# Patient Record
Sex: Female | Born: 1990 | Race: White | Hispanic: No | Marital: Single | Smoking: Former smoker
Health system: Southern US, Community
[De-identification: ages and names within clinical notes are randomized; demographics above are authoritative.]

## PROBLEM LIST (undated history)

## (undated) HISTORY — PX: WISDOM TOOTH EXTRACTION: SHX21

---

## 2015-05-01 ENCOUNTER — Ambulatory Visit
Admission: EM | Admit: 2015-05-01 | Discharge: 2015-05-01 | Disposition: A | Discharge status: Home / Self Care | Payer: Medicaid - Out of State | Attending: Family Medicine | Admitting: Family Medicine

## 2015-05-01 ENCOUNTER — Encounter: Payer: Self-pay | Admitting: *Deleted

## 2015-05-01 DIAGNOSIS — L299 Pruritus, unspecified: Secondary | ICD-10-CM

## 2015-05-01 DIAGNOSIS — B86 Scabies: Secondary | ICD-10-CM | POA: Diagnosis not present

## 2015-05-01 MED ORDER — PERMETHRIN 5 % EX CREA: 1.0000 "application " | TOPICAL_CREAM | Freq: Once | CUTANEOUS | Status: DC

## 2015-05-01 MED ORDER — RANITIDINE HCL 150 MG PO CAPS
150.0000 mg | ORAL_CAPSULE | Freq: Every day | ORAL | Status: DC
Start: 2015-05-01 — End: 2015-12-16

## 2015-05-01 MED ORDER — LORATADINE 10 MG PO TABS: 10.0000 mg | ORAL_TABLET | Freq: Every day | ORAL | Status: DC

## 2015-05-01 NOTE — Discharge Instructions (Signed)
Scabies, Adult Scabies is a skin condition that happens when very small insects get under the skin (infestation). This causes a rash and severe itchiness. Scabies can spread from person to person (is contagious). If you get scabies, it is common for others in your household to get scabies too. With proper treatment, symptoms usually go away in 2-4 weeks. Scabies usually does not cause lasting problems. CAUSES This condition is caused by mites (Sarcoptes scabiei, or human itch mites) that can only be seen with a microscope. The mites get into the top layer of skin and lay eggs. Scabies can spread from person to person through:  Close contact with a person who has scabies.  Contact with infested items, such as towels, bedding, or clothing. RISK FACTORS This condition is more likely to develop in:  People who live in nursing homes and other extended-care facilities.  People who have sexual contact with a partner who has scabies.  Young children who attend child care facilities.  People who care for others who are at increased risk for scabies. SYMPTOMS Symptoms of this condition may include:  Severe itchiness. This is often worse at night.  A rash that includes tiny red bumps or blisters. The rash commonly occurs on the wrist, elbow, armpit, fingers, waist, groin, or buttocks. Bumps may form a line (burrow) in some areas.  Skin irritation. This can include scaly patches or sores. DIAGNOSIS This condition is diagnosed with a physical exam. Your health care provider will look closely at your skin. In some cases, your health care provider may take a sample of your affected skin (skin scraping) and have it examined under a microscope. TREATMENT This condition may be treated with:  Medicated cream or lotion that kills the mites. This is spread on the entire body and left on for several hours. Usually, one treatment with medicated cream or lotion is enough to kill all of the mites. In severe  cases, the treatment may be repeated.  Medicated cream that relieves itching.  Medicines that help to relieve itching.  Medicines that kill the mites. This treatment is rarely used. HOME CARE INSTRUCTIONS Medicines  Take or apply over-the-counter and prescription medicines as told by your health care provider.  Apply medicated cream or lotion as told by your health care provider.  Do not wash off the medicated cream or lotion until the necessary amount of time has passed. Skin Care  Avoid scratching your affected skin.  Keep your fingernails closely trimmed to reduce injury from scratching.  Take cool baths or apply cool washcloths to help reduce itching. General Instructions  Clean all items that you recently had contact with, including bedding, clothing, and furniture. Do this on the same day that your treatment starts.  Use hot water when you wash items.  Place unwashable items into closed, airtight plastic bags for at least 3 days. The mites cannot live for more than 3 days away from human skin.  Vacuum furniture and mattresses that you use.  Make sure that other people who may have been infested are examined by a health care provider. These include members of your household and anyone who may have had contact with infested items.  Keep all follow-up visits as told by your health care provider. This is important. SEEK MEDICAL CARE IF:  You have itching that does not go away after 4 weeks of treatment.  You continue to develop new bumps or burrows.  You have redness, swelling, or pain in your rash area  after treatment.  You have fluid, blood, or pus coming from your rash.   This information is not intended to replace advice given to you by your health care provider. Make sure you discuss any questions you have with your health care provider.   Document Released: 10/30/2014 Document Reviewed: 09/10/2014 Elsevier Interactive Patient Education 2016 [employer]  Rash A rash is a change in the color or feel of your skin. There are many different types of rashes. You may have other problems along with your rash. HOME CARE  Avoid the thing that caused your rash.  Do not scratch your rash.  You may take cools baths to help stop itching.  Only take medicines as told by your doctor.  Keep all doctor visits as told. GET HELP RIGHT AWAY IF:   Your pain, puffiness (swelling), or redness gets worse.  You have a fever.  You have new or severe problems.  You have body aches, watery poop (diarrhea), or you throw up (vomit).  Your rash is not better after 3 days. MAKE SURE YOU:   Understand these instructions.  Will watch your condition.  Will get help right away if you are not doing well or get worse.   This information is not intended to replace advice given to you by your health care provider. Make sure you discuss any questions you have with your health care provider.   Document Released: 07/28/2007 Document Revised: 05/03/2011 Document Reviewed: 06/26/2014 Elsevier Interactive Patient Education [employer]vier Inc.

## 2015-05-01 NOTE — ED Provider Notes (Signed)
CSN: [id number]     Arrival date & time 05/01/15  1303 History   First MD Initiated Contact with Patient 05/01/15 1546    Nurses notes were reviewed. Chief Complaint  Patient presents with  . Rash  Is here because of a rash on her right hand right foot and right leg. She states she was visiting her mother [place] a [place] in [place] and they had a scabies infection that have been just recently treated. She states that she got home and noticed that she was having some itching of the right hand but couldn't see anything such thought she was okay. Last few days she's broken out in a rash with itching on the right foot right lower leg and right handout as well. She realizes she's got scabies infection that her mother and sibling had. She reports that the lesions are very pruritic in nature and that which took Benadryl it made her very sedated  Before she she smokes on no other current medical problems with her period is a history of heart disease in the family.   (Consider location/radiation/quality/duration/timing/severity/associated sxs/prior Treatment) Patient is a 25 y.o. female presenting with rash. The history is provided by the patient. No language interpreter was used.  Rash Location:  Foot, leg, hand and shoulder/arm Hand rash location:  Dorsum of R hand and R wrist Leg rash location:  R foot Foot rash location:  Top of R foot Quality: itchiness, redness and swelling   Quality: not blistering, not bruising and not draining   Severity:  Moderate Timing:  Constant Chronicity:  New Relieved by:  Nothing Ineffective treatments:  Antihistamines Associated symptoms: abdominal pain   Associated symptoms: no fatigue, no fever, no headaches and no URI     History reviewed. No pertinent past medical history. History reviewed. No pertinent past surgical history. History reviewed. No pertinent family history. Social History  Substance Use Topics  . Smoking status: Current Every Day Smoker  .  Smokeless tobacco: Never Used  . Alcohol Use: Yes   OB History    No data available     Review of Systems  Constitutional: Negative for fever and fatigue.  Gastrointestinal: Positive for abdominal pain.  Skin: Positive for rash.  Neurological: Negative for headaches.  All other systems reviewed and are negative.   Allergies  Review of patient's allergies indicates no known allergies.  Home Medications   Prior to Admission medications   Medication Sig Start Date End Date Taking? Authorizing Provider  etonogestrel (NEXPLANON) 68 MG IMPL implant 1 each by Subdermal route once.   Yes Historical Provider, MD  loratadine (CLARITIN) 10 MG tablet Take 1 tablet (10 mg total) by mouth daily. Take 1 tablet in the morning. As needed for itching. 05/01/15   Hassan RowanEugene Cian Costanzo, MD  permethrin (ELIMITE) 5 % cream Apply 1 application topically once. Follow hygienic practices as described use one time and repeat in a week if needed recommend anyone sharing her bed use it as well 05/01/15   Hassan RowanEugene Felishia Wartman, MD  ranitidine (ZANTAC) 150 MG capsule Take 1 capsule (150 mg total) by mouth daily. 05/01/15   Hassan RowanEugene Alontae Chaloux, MD   Meds Ordered and Administered this Visit  Medications - No data to display  BP 106/56 mmHg  Pulse 68  Temp(Src) 97.8 F (36.6 C) (Oral)  Resp 18  Ht 5\' 2"  (1.575 m)  Wt 155 lb (70.308 kg)  BMI 28.34 kg/m2  SpO2 100%  LMP 04/30/2015 No data found.   Physical Exam  Constitutional: She is oriented to person, place, and time. She appears well-developed and well-nourished.  HENT:  Head: Normocephalic and atraumatic.  Eyes: Pupils are equal, round, and reactive to light.  Neck: Neck supple.  Musculoskeletal: Normal range of motion. She exhibits no tenderness.  Neurological: She is alert and oriented to person, place, and time.  Skin: Rash noted. There is erythema.     Lesions on her right leg lower leg and right forearm and hand consistent with pediculosis infection and they does involve  the distal ends of both extremities  Psychiatric: She has a normal mood and affect. Her behavior is normal.  Vitals reviewed.   ED Course  Procedures (including critical care time)  Labs Review Labs Reviewed - No data to display  Imaging Review No results found.   Visual Acuity Review  Right Eye Distance:   Left Eye Distance:   Bilateral Distance:    Right Eye Near:   Left Eye Near:    Bilateral Near:         MDM   1. Scabies   2. Itching    We'll place patient on Elimite lotion went over hygienic instructions on how to prepare her house bedroom and her clothes. Also recommend that she have anyone who states with her be treated as well she states that her fianc shares a bed that she will talk to her fianc she may repeat Elimite lotion in one week and gave her a refill on the Elimite lotion. Will also place patient on Zantac twice a day and Claritin once a day for the itching recommend she stops the Benadryl since she is needs to wash the Elimite lotion off in 10 days and give her a note for work for today and tomorrow. Also started the things she can't wash like her shoes she should not wear for 3 or 4 days.       Hassan Rowan, MD 05/01/15 1640

## 2015-05-01 NOTE — ED Notes (Signed)
Patient noticed a few bumps on her right leg 4 days ago that have begin to spread on her right leg to her right arm. Patient reports that she visited her mother and brother in [place] and they were diagnosed with scabies.

## 2015-12-16 ENCOUNTER — Ambulatory Visit (INDEPENDENT_AMBULATORY_CARE_PROVIDER_SITE_OTHER): Payer: Self-pay

## 2015-12-16 ENCOUNTER — Ambulatory Visit
Admission: EM | Admit: 2015-12-16 | Discharge: 2015-12-16 | Disposition: A | Discharge status: Home / Self Care | Payer: Self-pay | Attending: Internal Medicine | Admitting: Internal Medicine

## 2015-12-16 DIAGNOSIS — S46912A Strain of unspecified muscle, fascia and tendon at shoulder and upper arm level, left arm, initial encounter: Secondary | ICD-10-CM

## 2015-12-16 NOTE — Discharge Instructions (Addendum)
Ice 10-15 minutes 2-4 times daily may help reduce pain.  Ibuprofen will help with pain, inflammation.  Prilosec (omeprazole) or rantidine (zantac) or pepcid (famotidine) may be helpful if stomach upset occurs with the ibuprofen.  Physical therapy may be helpful for improving shoulder movement as healing occurs.  Followup with orthopedist or sports medicine in a week or two.

## 2015-12-16 NOTE — ED Provider Notes (Signed)
[facility]    CSN: [id number] Arrival date & time: 12/16/15  1501     History   Chief Complaint Chief Complaint  Patient presents with  . Shoulder Pain    Left    HPI Meghan Barber is a 25 y.o. female. She was wrestling with a friend 4 days ago, and had the forced AB duction of her left shoulder across the front of her body. She was at that time prone, and then the friend applied some weight to the shoulder. She has had significant discomfort with range of motion of the left shoulder since. No tingling or numbness in the hand. Is not able to actively move the elbow away from the body. Not improving. No other injuries reported. Wrestled in high school.    HPI  History reviewed. No pertinent past medical history.   Past Surgical History:  Procedure Laterality Date  . WISDOM TOOTH EXTRACTION      Home Medications    Prior to Admission medications   Medication Sig Start Date End Date Taking? Authorizing Provider  etonogestrel (NEXPLANON) 68 MG IMPL implant 1 each by Subdermal route once.    Historical Provider, MD  taking ibuprofen 800mg  4x daily for shoulder pain  Family History Family History  Problem Relation Age of Onset  . Thyroid disease Mother     Social History Social History  Substance Use Topics  . Smoking status: Current Every Day Smoker  . Smokeless tobacco: Never Used  . Alcohol use Yes     Comment: occ     Allergies   Percocet [oxycodone-acetaminophen] and Vicodin [hydrocodone-acetaminophen]   Review of Systems Review of Systems  All other systems reviewed and are negative.    Physical Exam Triage Vital Signs ED Triage Vitals  Enc Vitals Group     BP 12/16/15 1516 129/69     Pulse Rate 12/16/15 1516 77     Resp 12/16/15 1516 17     Temp 12/16/15 1516 97.7 F (36.5 C)     Temp Source 12/16/15 1516 Tympanic     SpO2 12/16/15 1516 100 %     Weight 12/16/15 1514 145 lb (65.8 kg)     Height 12/16/15 1514 5\' 2"  (1.575 m)      Pain Score 12/16/15 1516 9   Updated Vital Signs BP 129/69 (BP Location: Left Arm)   Pulse 77   Temp 97.7 F (36.5 C) (Tympanic)   Resp 17   Ht 5\' 2"  (1.575 m)   Wt 145 lb (65.8 kg)   LMP 11/20/2015   SpO2 100%   BMI 26.52 kg/m  Physical Exam  Constitutional: She is oriented to person, place, and time. No distress.  Alert, nicely groomed  HENT:  Head: Atraumatic.  Eyes:  Conjugate gaze, no eye redness/drainage  Neck: Neck supple.  Cardiovascular: Normal rate.   Pulmonary/Chest: No respiratory distress.  Lungs clear, symmetric breath sounds  Abdominal: She exhibits no distension.  Musculoskeletal:  No leg swelling Extremely limited active ROM L shoulder (pain): no significant extension of arm at shoulder.  Able to extend arm at shoulder passively to 45 degrees.  Anterior aspect of shoulder contour is tender to palpation.  Spasm of L trapezius.    Neurological: She is alert and oriented to person, place, and time.  Skin: Skin is warm and dry.  No cyanosis  Nursing note and vitals reviewed.    UC Treatments / Results   Radiology Dg Shoulder Left  Result Date: 12/16/2015 CLINICAL  DATA:  Injured left shoulder while wrestling. Persistent pain and popping. EXAM: LEFT SHOULDER - 2+ VIEW COMPARISON:  None. FINDINGS: The joint spaces are maintained. No acute bony findings or bone lesion. No abnormal soft tissue calcifications. The visualized lung is clear and the visualized ribs are intact. IMPRESSION: Normal left shoulder radiographs. Electronically Signed   By: Rudie MeyerP.  Gallerani M.D.   On: 12/16/2015 16:17    Procedures Procedures (including critical care time)      None today   Final Clinical Impressions(s) / UC Diagnoses   Final diagnoses:  Strain of left shoulder, initial encounter   Ice 10-15 minutes 2-4 times daily may help reduce pain.  Ibuprofen will help with pain, inflammation.  Prilosec (omeprazole) or rantidine (zantac) or pepcid (famotidine) may be helpful  if stomach upset occurs with the ibuprofen.  Physical therapy may be helpful for improving shoulder movement as healing occurs.  Followup with orthopedist or sports medicine in a week or two.       Eustace MooreLaura W Chloeann Alfred, MD 12/17/15 [phone]

## 2015-12-16 NOTE — ED Triage Notes (Signed)
Patient states that on last Thursday night she was wrestling with a friend of hers and she felt a pop in her left shoulder about 4 times and she has been having pain since with limited ROM. Patient states that pain radiates down through deltoid muscle.

## 2015-12-18 ENCOUNTER — Telehealth: Payer: Self-pay

## 2015-12-18 NOTE — Telephone Encounter (Signed)
Tried to follow up with patient, however the number on file is no longer a working number.

## 2018-03-03 DIAGNOSIS — A6000 Herpesviral infection of urogenital system, unspecified: Secondary | ICD-10-CM | POA: Insufficient documentation

## 2018-03-03 DIAGNOSIS — E663 Overweight: Secondary | ICD-10-CM | POA: Insufficient documentation

## 2018-03-03 DIAGNOSIS — Z9152 Personal history of nonsuicidal self-harm: Secondary | ICD-10-CM | POA: Insufficient documentation

## 2018-03-03 DIAGNOSIS — Z8659 Personal history of other mental and behavioral disorders: Secondary | ICD-10-CM | POA: Insufficient documentation

## 2018-03-03 LAB — HM PAP SMEAR: HM Pap smear: NEGATIVE

## 2018-10-16 DIAGNOSIS — E663 Overweight: Secondary | ICD-10-CM

## 2018-10-16 DIAGNOSIS — Z9152 Personal history of nonsuicidal self-harm: Secondary | ICD-10-CM

## 2018-10-16 DIAGNOSIS — Z8659 Personal history of other mental and behavioral disorders: Secondary | ICD-10-CM

## 2018-10-17 ENCOUNTER — Ambulatory Visit (LOCAL_COMMUNITY_HEALTH_CENTER): Payer: Self-pay | Admitting: Nurse Practitioner

## 2018-10-17 ENCOUNTER — Other Ambulatory Visit: Payer: Self-pay

## 2018-10-17 DIAGNOSIS — Z3041 Encounter for surveillance of contraceptive pills: Secondary | ICD-10-CM

## 2018-10-17 DIAGNOSIS — Z3009 Encounter for other general counseling and advice on contraception: Secondary | ICD-10-CM

## 2018-10-17 DIAGNOSIS — Z113 Encounter for screening for infections with a predominantly sexual mode of transmission: Secondary | ICD-10-CM

## 2018-10-17 DIAGNOSIS — Z1331 Encounter for screening for depression: Secondary | ICD-10-CM

## 2018-10-17 LAB — WET PREP FOR TRICH, YEAST, CLUE
Trichomonas Exam: NEGATIVE
Yeast Exam: NEGATIVE

## 2018-10-17 LAB — HEMOGLOBIN, FINGERSTICK: Hemoglobin: 11.9 g/dL (ref 11.1–15.9)

## 2018-10-17 MED ORDER — NORGESTIM-ETH ESTRAD TRIPHASIC 0.18/0.215/0.25 MG-25 MCG PO TABS: 1.0000 | ORAL_TABLET | Freq: Every day | ORAL | 0 refills | Status: DC

## 2018-10-17 NOTE — Patient Instructions (Signed)
Depression Screening Depression screening is a tool that your health care provider can use to learn if you have symptoms of depression. Depression is a common condition with many symptoms that are also often found in other conditions. Depression is treatable, but it must first be diagnosed. You may not know that certain feelings, thoughts, and behaviors that you are having can be symptoms of depression. Taking a depression screening test can help you and your health care provider decide if you need more assessment, or if you should be referred to a mental health care provider. What are the screening tests?  You may have a physical exam to see if another condition is affecting your mental health. You may have a blood or urine sample taken during the physical exam.  You may be interviewed using a screening tool that was developed from research, such as one of these: ? Patient Health Questionnaire (PHQ). This is a set of either 2 or 9 questions. A health care provider who has been trained to score this screening test uses a guide to assess if your symptoms suggest that you may have depression. ? Hamilton Depression Rating Scale (HAM-D). This is a set of either 17 or 24 questions. You may be asked to take it again during or after your treatment, to see if your depression has gotten better. ? Beck Depression Inventory (BDI). This is a set of 21 multiple choice questions. Your health care provider scores your answers to assess:  Your level of depression, ranging from mild to severe.  Your response to treatment.  Your health care provider may talk with you about your daily activities, such as eating, sleeping, work, and recreation, and ask if you have had any changes in activity.  Your health care provider may ask you to see a mental health specialist, such as a psychiatrist or psychologist, for more evaluation. Who should be screened for depression?   All adults, including adults with a family history  of a mental health disorder.  Adolescents who are 12-18 years old.  People who are recovering from a myocardial infarction (MI).  Pregnant women, or women who have given birth.  People who have a long-term (chronic) illness.  Anyone who has been diagnosed with another type of a mental health disorder.  Anyone who has symptoms that could show depression. What do my results mean? Your health care provider will review the results of your depression screening, physical exam, and lab tests. Positive screens suggest that you may have depression. Screening is the first step in getting the care that you may need. It is up to you to get your screening results. Ask your health care provider, or the department that is doing your screening tests, when your results will be ready. Talk with your health care provider about your results and diagnosis. A diagnosis of depression is made using the Diagnostic and Statistical Manual of Mental Disorders (DSM-V). This is a book that lists the number and type of symptoms that must be present for a health care provider to give a specific diagnosis.  Your health care provider may work with you to treat your symptoms of depression, or your health care provider may help you find a mental health provider who can assess, diagnose, and treat your depression. Get help right away if:  You have thoughts about hurting yourself or others. If you ever feel like you may hurt yourself or others, or have thoughts about taking your own life, get help right away. You   can go to your nearest emergency department or call:  Your local emergency services (911 in the [place]  A suicide crisis helpline, such as the National Suicide Prevention Lifeline at 1-800-273-8255. This is open 24 hours a day. Summary  Depression screening is the first step in getting the help that you may need.  If your screening test shows symptoms of depression (is positive), your health care provider may ask  you to see a mental health provider.  Anyone who is age 12 or older should be screened for depression. This information is not intended to replace advice given to you by your health care provider. Make sure you discuss any questions you have with your health care provider. Document Released: 06/25/2016 Document Revised: 01/21/2017 Document Reviewed: 06/25/2016 Elsevier Patient Education  2020 [employer] Inc[employer]  

## 2018-10-17 NOTE — Progress Notes (Signed)
Family Planning Visit- Repeat Yearly Visit - to continue OCP's  Subjective:  Meghan Barber is a 28 y.o. being seen today for an well woman visit and to discuss family planning options.    She is currently using none for pregnancy prevention. Patient reports she does not if she or her partner wants a pregnancy in the next year. Patient  has H/O self mutilation; History of bulimia nervosa; Genital HSV; and Overweight on their problem list.  No chief complaint on file.   Patient reports - [facility]  Patient denies - any additional significant medical history  Does the patient desire a pregnancy in the next year? (OKQ flowsheet)  See flowsheet for other program required questions.   There is no height or weight on file to calculate BMI. - Patient is eligible for diabetes screening based on BMI and age 33>40?  no HA1C ordered? no  Patient reports 1 of partners in last year. Desires STI screening?  Yes  Does the patient have a current or past history of drug use? No   No components found for: HCV]   Health Maintenance Due  Topic Date Due  . HIV Screening  09/23/2007  . TETANUS/TDAP  09/23/2011  . INFLUENZA VACCINE  09/23/2018    Review of Systems  All other systems reviewed and are negative.   The following portions of the patient's history were reviewed and updated as appropriate: allergies, current medications, past family history, past medical history, past social history, past surgical history and problem list. Problem list updated.  Objective:  There were no vitals filed for this visit.  Physical Exam Vitals signs reviewed.  Constitutional:      Appearance: Normal appearance. She is well-developed and normal weight.  HENT:     Mouth/Throat:     Comments: Tooth decay noted - no missing teeth Cardiovascular:     Rate and Rhythm: Normal rate and regular rhythm.     Pulses:          Radial pulses are 2+ on the right side and 2+ on the left side.      Heart sounds: Normal heart sounds, S1 normal and S2 normal.  Pulmonary:     Effort: Pulmonary effort is normal.     Breath sounds: Normal breath sounds and air entry.  Musculoskeletal:     Right lower leg: No edema.     Left lower leg: No edema.  Skin:    General: Skin is warm and dry.  Neurological:     Mental Status: She is alert.  Psychiatric:        Behavior: Behavior is cooperative.      Assessment and Plan:  Meghan Barber is a 28 y.o. female [facility] for an initial well woman exam/family planning visit  Contraception counseling: Reviewed all forms of birth control options available including abstinence; over the counter/barrier methods; hormonal contraceptive medication including pill, patch, ring, injection,contraceptive implant; hormonal and nonhormonal IUDs; permanent sterilization options including vasectomy and the various tubal sterilization modalities. Risks and benefits reviewed.  Questions were answered.  Written information was also given to the patient to review.  Patient desires OCP's , this was prescribed for patient. She will follow up in  6 months for surveillance.  She was told to call with any further questions, or with any concerns about this method of contraception.  Emphasized use of condoms 100% of the time for STI prevention.  There are  no diagnoses linked to this encounter. 1. Family planning, BCP (birth control pills) maintenance Please give  - Norgestimate-Ethinyl Estradiol Triphasic 0.18/0.215/0.25 MG-25 MCG tab; Take 1 tablet by mouth daily.  Dispense: 3 Package; Refill: 0  Discussed with client how to properly take OCP's - discussed what to do if pills are missed - advised to use condoms consistently x 2 wks as back up method while starting OCP's.   2. Screening examination for STD (sexually transmitted disease) Await lab results - WET PREP FOR [place], [place] - please treat wet mount per standing  orer - HIV [place] [facility] - Syphilis Serology, [place] [facility] - Chlamydia/Gonorrhea [place] [facility]  3. Positive depression screening  - Hemoglobin, fingerstick - await results  Positive PHQ 9 score - please refer to Milton Ferguson for services - Ambulatory referral to [facility] understanding and is in agreement with plan of care     Return in about 6 months (around 04/19/2019) for Continue OCP's for [facility] - pick up remaining 6 packs of OCP's, PRN.  No future appointments.  Berniece Andreas, NP

## 2018-10-18 ENCOUNTER — Encounter: Payer: Self-pay | Admitting: Nurse Practitioner

## 2018-11-16 ENCOUNTER — Ambulatory Visit: Payer: Self-pay | Admitting: Licensed Clinical Social Worker

## 2018-11-16 ENCOUNTER — Encounter: Payer: Self-pay | Admitting: Licensed Clinical Social Worker

## 2018-11-16 ENCOUNTER — Other Ambulatory Visit: Payer: Self-pay

## 2018-11-16 DIAGNOSIS — F4323 Adjustment disorder with mixed anxiety and depressed mood: Secondary | ICD-10-CM | POA: Insufficient documentation

## 2018-11-16 NOTE — Progress Notes (Signed)
Counselor Initial Adult Exam  Name: Meghan Barber Date: 11/16/2018 MRN: [id number] DOB: [birth year] PCP: Patient, No Pcp Per  Time spent: 1 hour 10 minutes   A biopsychosocial was completed on the Patient. Background information and current concerns were obtained during an intake in the office with the [facility] Department clinician, Glori Bickers, LCSW.  Contact information and confidentiality was discussed and appropriate consents were signed.    Reason for Visit /Presenting Problem: Patient referred by Jerline Pain, FNP on 08/25/220 due to PHQ-9 = 9.  Patient reports that she has had many difficulties in relationships due to trust issues. She reports that a friendship that was meaningful to her ended recently due to the patient not believing that her friend was pregnant. Patient reports that there were several factors that made her believe her friend was making up the pregnancy, but in the end her questioning led this friend to end the relationship. Patient reports that this situation led to her going to hang out with some other friends and drinking - 3 beers and 1 shot in an hour. She reports that she blacked out for a 6 hour period and ended up getting a DUI during this blackout period. She reports that this is her 2nd DUI, the first occurred in 03/2016 after "drinnking a lot" to cope with her and her boyfriends issues at that time. Patient shares that she over-thinks things, has trust issues and is always looking for clues/reasons that she shouldn't trust someone or that someone isn't authentic. She reports a history of childhood instability, her mom having mental health issues, and experiencing homelessness. 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 Patient shares that due to issues she was having she moved to [facility] and ended up in bad relationship for a few years before she moved to [facility]. She moved  to [place] because her father was living here at the time. Patient reports anxious/paranoid thoughts about others intentions, a history of relationship instability, sabotaging relationships and career opportunities, and has a fear of failing. She reports that she has ruined job opportunities because of her worries about her boyfriend cheating. Patient reports a lot of social anxiety and fears of others judging her, she reports she drinks in social situations to feel comfortable. She currently endorses depressive symptoms due to recent events - ending of friendship and DUI. In addition, patient reports history of insomnia, and reports use of marijuana to help her sleep.   Mental Status Exam:   Appearance:   Casual     Behavior:  Appropriate  Motor:  Normal  Speech/Language:   Normal Rate  Affect:  Appropriate  Mood:  normal  Thought process:  normal  Thought content:    WNL  Sensory/Perceptual disturbances:    WNL  Orientation:  oriented to person, place and time/date  Attention:  Good  Concentration:  Good  Memory:  WNL  Fund of knowledge:   Good  Insight:    Good  Judgment:   Good  Impulse Control:  Good   Reported Symptoms:  Obsessive thinking, Anhedonia, Sleep disturbance, Appetite disturbance, Reckless behavior, Lack of motivation and anxiety, anxious thoughts   Risk Assessment: Danger to Self:  No Self-injurious Behavior: No Danger to Others: No Duty to Warn:no Physical Aggression / Violence:No  Access to Firearms a concern: No  Gang Involvement:No  Patient / guardian was educated about steps to take if  suicide or homicide risk level increases between visits: yes While future psychiatric events cannot be accurately predicted, the patient does not currently require acute inpatient psychiatric care and does not currently meet [facility] involuntary commitment criteria.  Substance Abuse History: Current substance abuse: Yes - Occasional marijuana usage. Alcohol use.   Past  Psychiatric History:   No previous psychological problems have been observed. Patient reports that her mom has history of unknown mental health issues and her father has a history of alcoholism.  Outpatient Providers:NA  History of Psych Hospitalization: No   Abuse History: Victim of No., NA    Report needed: No. Victim of Neglect:Yes.   Perpetrator of No   Witness / Exposure to Domestic Violence: No   Protective Services Involvement: No  Witness to [employer] Violence:  No   Family History:  Family History  Problem Relation Age of Onset  . Thyroid disease Mother   . Alcoholism Father     Social History:  Social History   Socioeconomic History  . Marital status: Single    Spouse name: Not on file  . Number of children: Not on file  . Years of education: Not on file  . Highest education level: Not on file  Occupational History  . Not on file  Social Needs  . Financial resource strain: Not on file  . Food insecurity    Worry: Not on file    Inability: Not on file  . Transportation needs    Medical: Not on file    Non-medical: Not on file  Tobacco Use  . Smoking status: Current Every Day Smoker  . Smokeless tobacco: Never Used  Substance and Sexual Activity  . Alcohol use: Yes    Comment: occ  . Drug use: No  . Sexual activity: Not on file  Lifestyle  . Physical activity    Days per week: 1 day    Minutes per session: 120 min  . Stress: Very much  Relationships  . Social Musicianconnections    Talks on phone: Not on file    Gets together: Not on file    Attends religious service: Not on file    Active member of club or organization: Not on file    Attends meetings of clubs or organizations: Not on file    Relationship status: Not on file  Other Topics Concern  . Not on file  Social History Narrative   Patient reports that she and her boyfriend of 3 years live together. She reports not having many friends, and doesn't have any family in [place]. Patient reports that her  family lives in [place]. She has a close relationship with her sister, and also reports having good relationships with her other family members.     Living situation: the patient lives with their partner  Sexual Orientation:  Straight  Relationship Status: living with boyfriend of 3 years   Name of spouse / other: NA              If a parent, number of children / ages: NA   Support Systems; significant other/boyfriend  And close relationship with sister.   Financial Stress:  None reported   Income/Employment/Disability: Employment  Financial plannerMilitary Service: No   Educational History: Education: student currently attending college  Religion/Sprituality/World View:   NA   Any cultural differences that may affect / interfere with treatment:  not applicable   Recreation/Hobbies: lifting weights   Stressors:Other: not having friends, recent end to close friendship  Strengths:  Able to Communicate Effectively and stable housing, close relationship with sister    Barriers:  None reported    Legal History: Pending legal issue / charges: The patient has been involved with the police as a result of 2 DUI's . History of legal issue / charges: DUI  Medical History/Surgical History:not reviewed No past medical history on file.  Past Surgical History:  Procedure Laterality Date  . WISDOM TOOTH EXTRACTION      Medications: Current Outpatient Medications  Medication Sig Dispense Refill  . Norgestimate-Ethinyl Estradiol Triphasic (ORTHO TRI-CYCLEN, 28,) 0.18/0.215/0.25 MG-35 MCG tablet Take 1 tablet by mouth daily.    . Norgestimate-Ethinyl Estradiol Triphasic 0.18/0.215/0.25 MG-25 MCG tab Take 1 tablet by mouth daily. 3 Package 0   No current facility-administered medications for this visit.     Allergies  Allergen Reactions  . Lidocaine Other (See Comments)    syncope  . Percocet [Oxycodone-Acetaminophen] Nausea And Vomiting  . Vicodin [Hydrocodone-Acetaminophen] Nausea And  Vomiting   Ms. Magenta Philips is a 28 year old female with no reported history of mental health diagnosis. Patient currently presents with depressive symptoms and anxiety. She describes symptoms of adjustment  disorder, including depressed mood and anxiety due to recent stressors - ending of close friendship and DUI. Patient also describes a history of challenges in interpersonal relationships, including anxious/paranoid thoughts about others intentions, a history of relationship instability, and sabotaging relationships.  Furthermore, patient reports fears of others judging her, and reports she drinks in social situations to feel comfortable. In addition, patient reports history of insomnia and reports marijuana use to help manage it. Patient reports that these symptoms significantly impact her functioning in multiple life domains.   Due to the above symptoms and patient's reported history, patient is diagnosed with Adjustment Disorder, With mixed anxiety and depressed mood. Patient's symptoms and alcohol use should continue to be monitored closely to provide further diagnosis clarification. Continued mental health treatment is needed to address patient's symptoms and monitor her safety and stability. Patient is recommended for outpatient therapy to further reduce her symptoms and improve her coping strategies.    There is no acute risk for suicide or violence at this time.  While future psychiatric events cannot be accurately predicted, the patient does not require acute inpatient psychiatric care and does not currently meet [facility] involuntary commitment criteria.    Diagnoses:    ICD-10-CM   1. Adjustment disorder with mixed anxiety and depressed mood  F43.23     Plan of Care:  LCSW provided psychoeducation on CBTs and discussed with patient developing a treatment plan using CBTs at next session.  Patient agreed to use of Zoom and scheduled an appointment for next week.  Encouraged  patient to have a physical with PCP due to reported heart palpitations and family history of tyroid disorder.  Patient's goal of treatment: To have better control over her mind, not mess things up, learn how not to be all or nothing.   Interpreter used: NA   Kathreen Cosier, LCSW

## 2018-11-22 ENCOUNTER — Encounter: Payer: Self-pay | Admitting: Licensed Clinical Social Worker

## 2018-11-22 ENCOUNTER — Ambulatory Visit: Payer: Self-pay | Admitting: Licensed Clinical Social Worker

## 2018-11-22 DIAGNOSIS — F4323 Adjustment disorder with mixed anxiety and depressed mood: Secondary | ICD-10-CM

## 2018-11-22 NOTE — Progress Notes (Signed)
Counselor/Therapist Progress Note  Patient ID: Meghan Barber, MRN: [id number],    Date: 11/22/2018  Time Spent: 87 minutes    Treatment Type: Psychotherapy  Reported Symptoms: Obsessive thinking, Sleep disturbance, Lack of motivation and anxiety, anxious thoughts  Mental Status Exam:   Appearance:   Casual     Behavior:  Appropriate and Sharing  Motor:  Normal  Speech/Language:   Normal Rate  Affect:  Appropriate  Mood:  normal  Thought process:  normal  Thought content:    WNL  Sensory/Perceptual disturbances:    WNL  Orientation:  oriented to person, place and time/date  Attention:  Good  Concentration:  Good  Memory:  WNL  Fund of knowledge:   Good  Insight:    Good  Judgment:   Good  Impulse Control:  Good   Risk Assessment: Danger to Self:  No Self-injurious Behavior: No Danger to Others: No Duty to Warn:no Physical Aggression / Violence:No  Access to Firearms a concern: No  Gang Involvement:No   Subjective: Patient was engaged and cooperative throughout the session using time effectively to discuss thoughts, feelings, and treatment plan.  Patient voices continued motivation for treatment and understanding of mood and anxiety issues related to significant events. Patient is likely to benefit from future treatment because she remains motivated to decrease symptoms and reports benefit of regular sessions.     Interventions: Cognitive Behavioral Therapy  Established psychological safety. Checked in with patient.  Checked in with patient and reviewed previous session, including assessment and goal of treatment. Reviewed CBTs. Explored patient's goal of treatment and worked collaboratively to develop CBT treatment plan. Provided Psychoeducation on mindfulness, engaged patient in mindfulness exercise, processed exercise, and contracted with patient to complete daily. Provided support through active listening, validation of feelings, and highlighted patient's  strengths.  Diagnosis:   ICD-10-CM   1. Adjustment disorder with mixed anxiety and depressed mood  F43.23     Plan:   To have better control over her mind, not mess things up, learn how not to be all or nothing.  Treatment Target: Understand the relationship between thoughts, emotions, and behaviors  - Psychoeducation on CBT model   - Teach the connection between thoughts, emotions, and behaviors  - Identify and describe emotions   Treatment Target: Continue to understand mood and anxiety symptoms  - Ongoing assessment  - Describe current and past experiences with mood and anxiety issues - Identify, if possible, the source of mood and anxiety challenges - Assess depth of mood and anxiety and need for  medication Treatment Target: Increase realistic balanced thinking  - Explore patient's thoughts, beliefs, automatic thoughts, assumptions  - Identify and replace thinking that leads to depression - Process distress and allow for emotional release  - Questioning and challenging thoughts - Cognitive reappraisal  -  Provided psychoeducation on core beliefs, explore, and assist patient in identifying core beliefs  Treatment Target: Reducing vulnerability to "emotional mind" - Values clarification   - Self-care - nutrition, sleep, exercise  - Increase positive events - pleasurable and meaningful - Mindfulness practices   Interpreter used: NA   Milton Ferguson, LCSW

## 2018-11-24 ENCOUNTER — Other Ambulatory Visit: Payer: Self-pay

## 2018-11-24 DIAGNOSIS — Z20822 Contact with and (suspected) exposure to covid-19: Secondary | ICD-10-CM

## 2018-11-25 LAB — NOVEL CORONAVIRUS, NAA: SARS-CoV-2, NAA: NOT DETECTED

## 2018-11-28 ENCOUNTER — Ambulatory Visit: Payer: Self-pay | Admitting: Licensed Clinical Social Worker

## 2018-11-28 DIAGNOSIS — F4323 Adjustment disorder with mixed anxiety and depressed mood: Secondary | ICD-10-CM

## 2018-11-28 NOTE — Progress Notes (Signed)
Counselor/Therapist Progress Note  Patient ID: Meghan Barber, MRN: [id number],    Date: 11/28/2018  Time Spent: 45 minutes  Treatment Type: Psychotherapy  Reported Symptoms: Obsessive thinking, Sleep disturbance and Anxiety, anxious thoughts  Mental Status Exam:  Appearance:   Casual     Behavior:  Appropriate and Sharing  Motor:  Normal  Speech/Language:   Normal Rate  Affect:  Appropriate  Mood:  normal  Thought process:  normal  Thought content:    WNL  Sensory/Perceptual disturbances:    WNL  Orientation:  oriented to person, place and time/date  Attention:  Good  Concentration:  Good  Memory:  WNL  Fund of knowledge:   Good  Insight:    Good  Judgment:   Good  Impulse Control:  Good   Risk Assessment: Danger to Self:  No Self-injurious Behavior: No Danger to Others: No Duty to Warn:no Physical Aggression / Violence:No  Access to Firearms a concern: No  Gang Involvement:No   Subjective: Patient was engaged and cooperative throughout the session using time effectively to discuss thoughts and feelings. Patient voices continued motivation for treatment, agreement with treatment plans and understanding of mood and anxiety issues. Patient is likely to benefit from future treatment because she remains motivated to decrease symptoms and improve functioning and reports benefit of regular sessions.    Interventions: Cognitive Behavioral Therapy  Established psychological safety. Checked in with patient and discussed current mood and psychosocial stressors. Reviewed previous session, including treatment plan, CBTs and mindfulness breathing. Engaged patient in charting out thoughts, feelings, and emotions and taught patient about ABC worksheet. Encouraged patient to journal using ABC method, and to use one minute counting breaths daily. Provided support through active listening, validation of feelings, and highlighted patient's strengths.  Diagnosis:   ICD-10-CM   1.  Adjustment disorder with mixed anxiety and depressed mood  F43.23     Plan: Check in on use of one minute counting breaths; journal using ABC method.   To have better control over her mind, notmess things up, learn hownot to be all or nothing.  Treatment Target: Understand the relationship between thoughts, emotions, and behaviors   Psychoeducation on CBT model    Teach the connection between thoughts, emotions, and behaviors   Identify and describe emotions   Treatment Target: Continue to understand mood and anxiety symptoms   Ongoing assessment   Describe current and past experiences with mood and anxiety issues  Identify, if possible, the source of mood and anxiety challenges  Assess depth of mood and anxiety and need for  medication Treatment Target: Increase realistic balanced thinking   Explore patient's thoughts, beliefs, automatic thoughts, assumptions   Identify and replace thinking that leads to depression  Process distress and allow for emotional release   Questioning and challenging thoughts  Cognitive reappraisal    Provided psychoeducation on core beliefs, explore, and assist patient in identifying core beliefs  Treatment Target: Reducing vulnerability to "emotional mind"  Values clarification    Self-care - nutrition, sleep, exercise   Increase positive events - pleasurable and meaningful  Mindfulness practices   Interpreter used: NA   Milton Ferguson, LCSW

## 2018-12-05 ENCOUNTER — Ambulatory Visit: Payer: Self-pay | Admitting: Licensed Clinical Social Worker

## 2018-12-05 DIAGNOSIS — F4323 Adjustment disorder with mixed anxiety and depressed mood: Secondary | ICD-10-CM

## 2018-12-05 NOTE — Progress Notes (Signed)
Counselor/Therapist Progress Note  Patient ID: Meghan Barber, MRN: [id number],    Date: 12/05/2018  Time Spent: 45 minutes    Treatment Type: Psychotherapy  Reported Symptoms: overall mood stability; anxious thoughts   Mental Status Exam:  Appearance:   Casual     Behavior:  Appropriate and Sharing  Motor:  Normal  Speech/Language:   Normal Rate  Affect:  Appropriate  Mood:  euthymic  Thought process:  normal  Thought content:    WNL  Sensory/Perceptual disturbances:    WNL  Orientation:  oriented to person, place and time/date  Attention:  Good  Concentration:  Good  Memory:  WNL  Fund of knowledge:   Good  Insight:    Good  Judgment:   Good  Impulse Control:  Good   Risk Assessment: Danger to Self:  No Self-injurious Behavior: No Danger to Others: No Duty to Warn:no Physical Aggression / Violence:No  Access to Firearms a concern: No  Gang Involvement:No   Subjective: Patient was engaged and cooperative throughout the session using time effectively to discuss thoughts and feelings. Patient voices continued motivation for treatment and reports writing out thoughts, emotions, and behaviors and use of mindfulness 1 minute breathing exercises. Patient is likely to benefit from future treatment because she remains motivated to decrease symptoms of anxiety and depressed mood and reports benefit of regular sessions in addressing these symptoms.   Interventions: Cognitive Behavioral Therapy  Established psychological safety. Checked in with patient regarding her week - overall mood stability with continued moments of intense anxious thoughts. Reviewed use of ABC method to write out thoughts, emotions, and behaviors. Discussed patient's challenges with day dreaming. Continued to teach patient about mindfulness. Led patient in mindfulness breathing exercise, processed exercise.  Encouraged patient to continue using these coping skills.  Provided support through active listening,  validation of feelings, and highlighted patient's strengths.     Diagnosis:   ICD-10-CM   1. Adjustment disorder with mixed anxiety and depressed mood  F43.23     Plan:  Continue Thought work and mindfulness exercises.  To have better control over her mind, notmess things up, learn hownot to be all or nothing.  Treatment Target: Understand the relationship between thoughts, emotions, and behaviors   Psychoeducation on CBT model    Teach the connection between thoughts, emotions, and behaviors   Identify and describe emotions   Treatment Target: Continue to understand mood and anxiety symptoms   Ongoing assessment   Describe current and past experiences with mood and anxiety issues  Identify, if possible, the source of mood and anxiety challenges  Assess depth of mood and anxiety and need for  medication Treatment Target: Increase realistic balanced thinking   Explore patient's thoughts, beliefs, automatic thoughts, assumptions   Identify and replace thinking that leads to depression  Process distress and allow for emotional release   Questioning and challenging thoughts  Cognitive reappraisal    Provided psychoeducation on core beliefs, explore, and assist patient in identifying core beliefs  Treatment Target: Reducing vulnerability to "emotional mind"  Values clarification    Self-care - nutrition, sleep, exercise   Increase positive events - pleasurable and meaningful  Mindfulness practices   Interpreter used: NA    Milton Ferguson, LCSW

## 2018-12-19 ENCOUNTER — Ambulatory Visit: Payer: Self-pay | Admitting: Licensed Clinical Social Worker

## 2018-12-19 DIAGNOSIS — F4323 Adjustment disorder with mixed anxiety and depressed mood: Secondary | ICD-10-CM

## 2018-12-19 NOTE — Progress Notes (Signed)
Counselor/Therapist Progress Note  Patient ID: Meghan Barber, MRN: [id number],    Date: 12/19/2018  Time Spent: 45 minutes   Treatment Type: Psychotherapy  Reported Symptoms: anxiety, anxious thoughts, physical sensations  Mental Status Exam:  Appearance:   Well Groomed     Behavior:  Appropriate and Sharing  Motor:  Normal  Speech/Language:   Negative and Normal Rate  Affect:  Appropriate and Congruent  Mood:  normal  Thought process:  normal  Thought content:    WNL  Sensory/Perceptual disturbances:    WNL  Orientation:  oriented to person, place and time/date  Attention:  Good  Concentration:  Good  Memory:  WNL  Fund of knowledge:   Good  Insight:    Good  Judgment:   Good  Impulse Control:  Good   Risk Assessment: Danger to Self:  No Self-injurious Behavior: No Danger to Others: No Duty to Warn:no Physical Aggression / Violence:No  Access to Firearms a concern: No  Gang Involvement:No   Subjective: Patient was engaged and cooperative throughout the session using time effectively to discuss thoughts and feelings. Patient voices continued motivation for treatment and understanding of mood and anxiety issues. Patient is likely to benefit from future treatment because she remains motivated to decrease symptoms and reports benefit of regular sessions. Due to patient's significant anxiety symptoms patient is recommended for a medication evaluation and is encouraged to follow up with PCP. Patient complains of significant worries, anxious thoughts, obsessive thoughts "sticky" thoughts, sleep disturbance. Continued assessment and monitoring is indicated to provide further diagnosis clarification of GAD and/or OCD.   Interventions: Cognitive Behavioral Therapy  Established psychological safety. Checked in with patient and reviewed previous session and homework task. Praised patient for completing homework. Explored patient's perception of multiple incidents (mistake at work;  inviting friend over, intrusive thoughts about trust) identifying unhelpful thoughts leading to distress and reframeing these thoughts. Encouraged patient to notice self-talk. And discussed patient challenging thoughts - "what would you tell a friend?" Continued to assess patient's anxiety and discussed patient getting a medication evaluation. Provided support through active listening, validation of feelings, and highlighted patient's strengths.   Diagnosis:   ICD-10-CM   1. Adjustment disorder with mixed anxiety and depressed mood  F43.23     Plan: Assess patient's anxiety symptoms. Lead patient in mindfulness exercise.   To have better control over her mind, notmess things up, learn hownot to be all or nothing.  Treatment Target: Understand the relationship between thoughts, emotions, and behaviors   Psychoeducation on CBT model   Teach the connection between thoughts, emotions, and behaviors   Identify and describe emotions   Treatment Target: Continue to understand mood and anxiety symptoms  Ongoing assessment  Describe current and past experiences withmood and anxiety issues  Identify, if possible, the source of moodand anxiety challenges  Assess depth ofmood and anxietyand need for medication Treatment Target: Increase realistic balanced thinking   Explore patient's thoughts, beliefs, automatic thoughts, assumptions   Identify and replace thinking that leads to depression  Process distress and allow for emotional release   Questioning and challenging thoughts  Cognitive reappraisal   Provided psychoeducation on core beliefs, explore, and assist patient in identifying core beliefs  Treatment Target: Reducing vulnerability to "emotional mind"  Values clarification   Self-care -nutrition, sleep, exercise   Increase positive events- pleasurable and meaningful  Mindfulness practices  Interpreter used: NA   Milton Ferguson, LCSW

## 2018-12-26 ENCOUNTER — Telehealth: Payer: Self-pay | Admitting: Licensed Clinical Social Worker

## 2018-12-26 ENCOUNTER — Ambulatory Visit: Payer: Self-pay | Admitting: Licensed Clinical Social Worker

## 2019-01-01 NOTE — Telephone Encounter (Signed)
Reschedule patient's missed appt.

## 2019-01-07 ENCOUNTER — Emergency Department: Payer: Worker's Compensation | Admitting: Certified Registered"

## 2019-01-07 ENCOUNTER — Other Ambulatory Visit: Payer: Self-pay

## 2019-01-07 ENCOUNTER — Encounter: Payer: Self-pay | Admitting: Emergency Medicine

## 2019-01-07 ENCOUNTER — Emergency Department: Payer: Worker's Compensation

## 2019-01-07 ENCOUNTER — Encounter
Admission: EM | Disposition: A | Discharge status: Still In Hospital | Payer: Self-pay | Source: Home / Self Care | Attending: Emergency Medicine

## 2019-01-07 ENCOUNTER — Emergency Department
Admission: EM | Admit: 2019-01-07 | Discharge: 2019-01-07 | Disposition: A | Discharge status: Still In Hospital | Payer: Worker's Compensation | Attending: Orthopedic Surgery | Admitting: Orthopedic Surgery

## 2019-01-07 DIAGNOSIS — Z79899 Other long term (current) drug therapy: Secondary | ICD-10-CM | POA: Insufficient documentation

## 2019-01-07 DIAGNOSIS — S61442A Puncture wound with foreign body of left hand, initial encounter: Secondary | ICD-10-CM | POA: Insufficient documentation

## 2019-01-07 DIAGNOSIS — E669 Obesity, unspecified: Secondary | ICD-10-CM | POA: Insufficient documentation

## 2019-01-07 DIAGNOSIS — Z6828 Body mass index (BMI) 28.0-28.9, adult: Secondary | ICD-10-CM | POA: Diagnosis not present

## 2019-01-07 DIAGNOSIS — Z8489 Family history of other specified conditions: Secondary | ICD-10-CM | POA: Diagnosis not present

## 2019-01-07 DIAGNOSIS — Z884 Allergy status to anesthetic agent status: Secondary | ICD-10-CM | POA: Insufficient documentation

## 2019-01-07 DIAGNOSIS — Z20828 Contact with and (suspected) exposure to other viral communicable diseases: Secondary | ICD-10-CM | POA: Diagnosis not present

## 2019-01-07 DIAGNOSIS — W25XXXA Contact with sharp glass, initial encounter: Secondary | ICD-10-CM | POA: Insufficient documentation

## 2019-01-07 DIAGNOSIS — Z8349 Family history of other endocrine, nutritional and metabolic diseases: Secondary | ICD-10-CM | POA: Diagnosis not present

## 2019-01-07 DIAGNOSIS — W458XXA Other foreign body or object entering through skin, initial encounter: Secondary | ICD-10-CM | POA: Insufficient documentation

## 2019-01-07 DIAGNOSIS — Z811 Family history of alcohol abuse and dependence: Secondary | ICD-10-CM | POA: Insufficient documentation

## 2019-01-07 DIAGNOSIS — Z885 Allergy status to narcotic agent status: Secondary | ICD-10-CM | POA: Diagnosis not present

## 2019-01-07 DIAGNOSIS — S60559A Superficial foreign body of unspecified hand, initial encounter: Secondary | ICD-10-CM

## 2019-01-07 DIAGNOSIS — Z818 Family history of other mental and behavioral disorders: Secondary | ICD-10-CM | POA: Diagnosis not present

## 2019-01-07 DIAGNOSIS — Z886 Allergy status to analgesic agent status: Secondary | ICD-10-CM | POA: Insufficient documentation

## 2019-01-07 DIAGNOSIS — Z87891 Personal history of nicotine dependence: Secondary | ICD-10-CM | POA: Diagnosis not present

## 2019-01-07 DIAGNOSIS — Z23 Encounter for immunization: Secondary | ICD-10-CM | POA: Insufficient documentation

## 2019-01-07 DIAGNOSIS — F4323 Adjustment disorder with mixed anxiety and depressed mood: Secondary | ICD-10-CM | POA: Insufficient documentation

## 2019-01-07 DIAGNOSIS — Y9389 Activity, other specified: Secondary | ICD-10-CM | POA: Diagnosis not present

## 2019-01-07 HISTORY — PX: INCISION AND DRAINAGE: SHX5863

## 2019-01-07 LAB — SARS CORONAVIRUS 2 BY RT PCR (HOSPITAL ORDER, PERFORMED IN ~~LOC~~ HOSPITAL LAB): SARS Coronavirus 2: NEGATIVE

## 2019-01-07 LAB — POCT PREGNANCY, URINE: Preg Test, Ur: NEGATIVE

## 2019-01-07 SURGERY — INCISION AND DRAINAGE
Anesthesia: General | Laterality: Left

## 2019-01-07 MED ORDER — MORPHINE SULFATE (PF) 10 MG/ML IV SOLN
INTRAVENOUS | Status: AC
  Administered 2019-01-07: 19:00:00 2 mg
  Filled 2019-01-07: qty 1

## 2019-01-07 MED ORDER — DEXAMETHASONE SODIUM PHOSPHATE 10 MG/ML IJ SOLN
INTRAMUSCULAR | Status: DC | PRN
  Administered 2019-01-07: 10 mg via INTRAVENOUS

## 2019-01-07 MED ORDER — FENTANYL CITRATE (PF) 100 MCG/2ML IJ SOLN
INTRAMUSCULAR | Status: DC | PRN
  Administered 2019-01-07 (×2): 50 ug via INTRAVENOUS

## 2019-01-07 MED ORDER — ONDANSETRON HCL 4 MG/2ML IJ SOLN: 4.0000 mg | Freq: Four times a day (QID) | INTRAMUSCULAR | Status: DC | PRN

## 2019-01-07 MED ORDER — KETOROLAC TROMETHAMINE 15 MG/ML IJ SOLN
INTRAMUSCULAR | Status: AC
  Filled 2019-01-07: qty 1

## 2019-01-07 MED ORDER — CEFAZOLIN SODIUM-DEXTROSE 2-4 GM/100ML-% IV SOLN
2.0000 g | INTRAVENOUS | Status: AC
Start: 2019-01-07 — End: 2019-01-07
  Administered 2019-01-07: 2 g via INTRAVENOUS
  Filled 2019-01-07: qty 100

## 2019-01-07 MED ORDER — TETANUS-DIPHTH-ACELL PERTUSSIS 5-2.5-18.5 LF-MCG/0.5 IM SUSP
0.5000 mL | Freq: Once | INTRAMUSCULAR | Status: AC
  Administered 2019-01-07: 12:00:00 0.5 mL via INTRAMUSCULAR
  Filled 2019-01-07: qty 0.5

## 2019-01-07 MED ORDER — DOCUSATE SODIUM 100 MG PO CAPS: 100.0000 mg | ORAL_CAPSULE | Freq: Every day | ORAL | 2 refills | Status: DC | PRN

## 2019-01-07 MED ORDER — LACTATED RINGERS IV SOLN
INTRAVENOUS | Status: DC | PRN
  Administered 2019-01-07: 18:00:00 via INTRAVENOUS

## 2019-01-07 MED ORDER — MIDAZOLAM HCL 2 MG/2ML IJ SOLN
INTRAMUSCULAR | Status: AC
  Filled 2019-01-07: qty 2

## 2019-01-07 MED ORDER — PROPOFOL 10 MG/ML IV BOLUS
INTRAVENOUS | Status: AC
  Filled 2019-01-07: qty 20

## 2019-01-07 MED ORDER — FENTANYL CITRATE (PF) 100 MCG/2ML IJ SOLN
INTRAMUSCULAR | Status: AC
  Filled 2019-01-07: qty 2

## 2019-01-07 MED ORDER — HYDROCODONE-ACETAMINOPHEN 5-325 MG PO TABS
1.0000 | ORAL_TABLET | Freq: Once | ORAL | Status: AC
  Administered 2019-01-07 (×2): 1 via ORAL
  Filled 2019-01-07: qty 1

## 2019-01-07 MED ORDER — CEPHALEXIN 500 MG PO CAPS: 500.0000 mg | ORAL_CAPSULE | Freq: Four times a day (QID) | ORAL | 0 refills | Status: AC

## 2019-01-07 MED ORDER — LIDOCAINE HCL (PF) 2 % IJ SOLN
INTRAMUSCULAR | Status: AC
  Filled 2019-01-07: qty 10

## 2019-01-07 MED ORDER — FENTANYL CITRATE (PF) 100 MCG/2ML IJ SOLN
INTRAMUSCULAR | Status: AC
Start: 2019-01-07 — End: ?
  Filled 2019-01-07: qty 2

## 2019-01-07 MED ORDER — MORPHINE SULFATE (PF) 4 MG/ML IV SOLN: 0.5000 mg | INTRAVENOUS | Status: DC | PRN

## 2019-01-07 MED ORDER — HYDROCODONE-ACETAMINOPHEN 5-325 MG PO TABS: 1.0000 | ORAL_TABLET | ORAL | Status: DC | PRN

## 2019-01-07 MED ORDER — ONDANSETRON HCL 4 MG PO TABS: 4.0000 mg | ORAL_TABLET | Freq: Four times a day (QID) | ORAL | Status: DC | PRN

## 2019-01-07 MED ORDER — METOCLOPRAMIDE HCL 10 MG PO TABS: 5.0000 mg | ORAL_TABLET | Freq: Three times a day (TID) | ORAL | Status: DC | PRN

## 2019-01-07 MED ORDER — ONDANSETRON HCL 4 MG/2ML IJ SOLN
INTRAMUSCULAR | Status: DC | PRN
  Administered 2019-01-07: 4 mg via INTRAVENOUS

## 2019-01-07 MED ORDER — HYDROCODONE-ACETAMINOPHEN 7.5-325 MG PO TABS
1.0000 | ORAL_TABLET | ORAL | Status: DC | PRN
  Filled 2019-01-07: qty 2

## 2019-01-07 MED ORDER — METOCLOPRAMIDE HCL 5 MG/ML IJ SOLN: 5.0000 mg | Freq: Three times a day (TID) | INTRAMUSCULAR | Status: DC | PRN

## 2019-01-07 MED ORDER — HYDROCODONE-ACETAMINOPHEN 5-325 MG PO TABS
ORAL_TABLET | ORAL | Status: AC
  Administered 2019-01-07: 20:00:00 1 via ORAL
  Filled 2019-01-07: qty 1

## 2019-01-07 MED ORDER — BACITRACIN 50000 UNITS IM SOLR
INTRAMUSCULAR | Status: AC
  Filled 2019-01-07: qty 1

## 2019-01-07 MED ORDER — HYDROCODONE-ACETAMINOPHEN 5-325 MG PO TABS: 1.0000 | ORAL_TABLET | ORAL | 0 refills | Status: DC | PRN

## 2019-01-07 MED ORDER — MORPHINE SULFATE (PF) 4 MG/ML IV SOLN
INTRAVENOUS | Status: AC
  Administered 2019-01-07: 20:00:00 2 mg via INTRAVENOUS
  Filled 2019-01-07: qty 1

## 2019-01-07 MED ORDER — DEXAMETHASONE SODIUM PHOSPHATE 10 MG/ML IJ SOLN
INTRAMUSCULAR | Status: AC
  Filled 2019-01-07: qty 1

## 2019-01-07 MED ORDER — LACTATED RINGERS IV SOLN: INTRAVENOUS | Status: DC

## 2019-01-07 MED ORDER — KETOROLAC TROMETHAMINE 15 MG/ML IJ SOLN
15.0000 mg | Freq: Four times a day (QID) | INTRAMUSCULAR | Status: DC
  Administered 2019-01-07: 15 mg via INTRAVENOUS
  Filled 2019-01-07: qty 1

## 2019-01-07 MED ORDER — MIDAZOLAM HCL 5 MG/5ML IJ SOLN
INTRAMUSCULAR | Status: DC | PRN
  Administered 2019-01-07: 2 mg via INTRAVENOUS

## 2019-01-07 MED ORDER — PROMETHAZINE HCL 25 MG/ML IJ SOLN: 6.2500 mg | INTRAMUSCULAR | Status: DC | PRN

## 2019-01-07 MED ORDER — ACETAMINOPHEN 325 MG PO TABS: 325.0000 mg | ORAL_TABLET | Freq: Four times a day (QID) | ORAL | Status: DC | PRN

## 2019-01-07 MED ORDER — SODIUM CHLORIDE FLUSH 0.9 % IV SOLN
INTRAVENOUS | Status: AC
  Filled 2019-01-07: qty 10

## 2019-01-07 MED ORDER — PROPOFOL 10 MG/ML IV BOLUS
INTRAVENOUS | Status: DC | PRN
  Administered 2019-01-07: 200 mg via INTRAVENOUS

## 2019-01-07 MED ORDER — LIDOCAINE HCL (PF) 1 % IJ SOLN
5.0000 mL | Freq: Once | INTRAMUSCULAR | Status: AC
  Administered 2019-01-07: 5 mL via INTRADERMAL
  Filled 2019-01-07: qty 5

## 2019-01-07 MED ORDER — MORPHINE SULFATE (PF) 4 MG/ML IV SOLN
2.0000 mg | INTRAVENOUS | Status: DC | PRN
  Administered 2019-01-07 (×5): 2 mg via INTRAVENOUS

## 2019-01-07 MED ORDER — ACETAMINOPHEN 500 MG PO TABS: 500.0000 mg | ORAL_TABLET | Freq: Four times a day (QID) | ORAL | Status: DC

## 2019-01-07 MED ORDER — ONDANSETRON HCL 4 MG/2ML IJ SOLN
INTRAMUSCULAR | Status: AC
  Filled 2019-01-07: qty 2

## 2019-01-07 SURGICAL SUPPLY — 35 items
BNDG COHESIVE 4X5 TAN STRL (GAUZE/BANDAGES/DRESSINGS) ×3 IMPLANT
BNDG GAUZE 4.5X4.1 6PLY STRL (MISCELLANEOUS) ×3 IMPLANT
BRUSH SCRUB EZ  4% CHG (MISCELLANEOUS) ×4
BRUSH SCRUB EZ 4% CHG (MISCELLANEOUS) ×2 IMPLANT
CANISTER SUCT 1200ML W/VALVE (MISCELLANEOUS) IMPLANT
CANISTER SUCT 3000ML PPV (MISCELLANEOUS) ×3 IMPLANT
CHLORAPREP W/TINT 26 (MISCELLANEOUS) ×6 IMPLANT
COVER WAND RF STERILE (DRAPES) ×3 IMPLANT
DRAPE 3/4 80X56 (DRAPES) IMPLANT
DRAPE FLUOR MINI C-ARM 54X84 (DRAPES) ×3 IMPLANT
DRAPE INCISE IOBAN 66X60 STRL (DRAPES) ×3 IMPLANT
DRAPE SURG 17X11 SM STRL (DRAPES) IMPLANT
DRAPE U-SHAPE 47X51 STRL (DRAPES) IMPLANT
ELECT REM PT RETURN 9FT ADLT (ELECTROSURGICAL) ×3
ELECTRODE REM PT RTRN 9FT ADLT (ELECTROSURGICAL) ×1 IMPLANT
GAUZE XEROFORM 1X8 LF (GAUZE/BANDAGES/DRESSINGS) ×3 IMPLANT
GLOVE INDICATOR 8.0 STRL GRN (GLOVE) ×3 IMPLANT
GLOVE SURG ORTHO 8.0 STRL STRW (GLOVE) ×3 IMPLANT
GOWN STRL REUS W/ TWL LRG LVL3 (GOWN DISPOSABLE) ×1 IMPLANT
GOWN STRL REUS W/ TWL XL LVL3 (GOWN DISPOSABLE) ×1 IMPLANT
GOWN STRL REUS W/TWL LRG LVL3 (GOWN DISPOSABLE) ×2
GOWN STRL REUS W/TWL XL LVL3 (GOWN DISPOSABLE) ×2
KIT TURNOVER KIT A (KITS) ×3 IMPLANT
NS IRRIG 1000ML POUR BTL (IV SOLUTION) ×3 IMPLANT
PACK EXTREMITY ARMC (MISCELLANEOUS) ×3 IMPLANT
STAPLER SKIN PROX 35W (STAPLE) ×3 IMPLANT
STOCKINETTE IMPERVIOUS 9X36 MD (GAUZE/BANDAGES/DRESSINGS) ×3 IMPLANT
SUT ETHILON 2 0 FSLX (SUTURE) IMPLANT
SUT ETHILON NAB PS2 4-0 18IN (SUTURE) IMPLANT
SUT PDS AB 2-0 CT1 27 (SUTURE) IMPLANT
SUT VIC AB 1 CT1 36 (SUTURE) IMPLANT
SUT VIC AB 2-0 CT1 27 (SUTURE)
SUT VIC AB 2-0 CT1 TAPERPNT 27 (SUTURE) IMPLANT
SUT VICRYL AB 3-0 FS1 BRD 27IN (SUTURE) IMPLANT
TOWEL OR 17X26 4PK STRL BLUE (TOWEL DISPOSABLE) ×3 IMPLANT

## 2019-01-07 NOTE — ED Notes (Signed)
Dr Williams at bedside 

## 2019-01-07 NOTE — Op Note (Signed)
01/07/2019  7:17 PM  PATIENT:  Meghan Barber    PRE-OPERATIVE DIAGNOSIS:  glass in left hand, puncture wound  POST-OPERATIVE DIAGNOSIS:  Same  PROCEDURE:  INCISION AND DRAINAGE and removal foreign body, left hand  SURGEON:  Lovell Sheehan, MD  ASSIST: None  ANESTHESIA:   General  EBL: less than 25 mL   TOURNIQUET TIME: 15 MIN  PREOPERATIVE INDICATIONS:  Meghan Barber is a  28 y.o. female who failed conservative measures and elected for surgical management of the deep infection.   The risks benefits and alternatives were discussed with the patient preoperatively including but not limited to the risks of infection, bleeding, nerve injury, incomplete relief of symptoms, pillar pain, cardiopulmonary complications, the need for revision surgery, among others, and the patient was willing to proceed.  OPERATIVE FINDINGS: 4 mm puncture wound with necrotic skin edge, 2 foreign bodies deep to the superficial arch  OPERATIVE PROCEDURE:   After informed consent was obtained and the appropriate extremity marked in the pre-operative holding area, the patient was taken to the operating room and placed in the supine position. General anesthesia was induced and the upper extremity was prepped and draped in standard sterile fashion. The extremity was elevated and the tourniquet insufflated to 250 mmHg. An incision was made proximal and distal from the puncture wound. Sharp dissection with the scalpel and iris scissor was utilized to debride the skin edges. Necrotic tissue was sharply debrided with the iris scissors. Care was taken to protect the tendinous and nervous structures. Two large glass pieces were removed, one cylindrical piece 2 cm by 1 cm and a single shard 1 cm in length. The wound was irrigated with more than 1 liter of bacitracin laced normal saline. The skin was closed loosely with 4-0 nylon. A sterile dressing was applied followed by a volar resting splint.   Meghan Bushman,  MD

## 2019-01-07 NOTE — Discharge Instructions (Signed)
AMBULATORY SURGERY  DISCHARGE INSTRUCTIONS   1) The drugs that you were given will stay in your system until tomorrow so for the next 24 hours you should not:  A) Drive an automobile B) Make any legal decisions C) Drink any alcoholic beverage   2) You may resume regular meals tomorrow.  Today it is better to start with liquids and gradually work up to solid foods.  You may eat anything you prefer, but it is better to start with liquids, then soup and crackers, and gradually work up to solid foods.   3) Please notify your doctor immediately if you have any unusual bleeding, trouble breathing, redness and pain at the surgery site, drainage, fever, or pain not relieved by medication.    4) Additional Instructions:          Please contact your physician with any problems or Same Day Surgery at [phone], Monday through Friday 6 am to 4 pm, or [place] at [facility] number at [phone]. AMBULATORY SURGERY  DISCHARGE INSTRUCTIONS   1) The drugs that you were given will stay in your system until tomorrow so for the next 24 hours you should not:  A) Drive an automobile B) Make any legal decisions C) Drink any alcoholic beverage   2) You may resume regular meals tomorrow.  Today it is better to start with liquids and gradually work up to solid foods.  You may eat anything you prefer, but it is better to start with liquids, then soup and crackers, and gradually work up to solid foods.   3) Please notify your doctor immediately if you have any unusual bleeding, trouble breathing, redness and pain at the surgery site, drainage, fever, or pain not relieved by medication.    4) Additional Instructions:        Please contact your physician with any problems or Same Day Surgery at [phone], Monday through Friday 6 am to 4 pm, or [place] at [facility] number at [phone]. Keep splint clean and dry Elevate left hand above heart Non-weight bearing  with your left hand  See Dr. Peggye Ley on Tuesday 01/09/2019 at [facility] in [place]  Call with any questions or concerns including fever, drainage or shortness of breath

## 2019-01-07 NOTE — Anesthesia Post-op Follow-up Note (Signed)
Anesthesia QCDR form completed.        

## 2019-01-07 NOTE — ED Triage Notes (Addendum)
Presents with puncture wound to palm of left hand   States she was washing a plastic glass with a stem  The stem of glass broke   Having pain to palm with some numbness to 3rd and 4 th fingers  No redness or swelling noted this am

## 2019-01-07 NOTE — Anesthesia Preprocedure Evaluation (Addendum)
Anesthesia Evaluation  Patient identified by MRN, date of birth, ID band Patient awake    Reviewed: Allergy & Precautions, H&P , NPO status , Patient's Chart, lab work & pertinent test results  Airway Mallampati: II  TM Distance: >3 FB Neck ROM: full    Dental  (+) Teeth Intact   Pulmonary former smoker,           Cardiovascular negative cardio ROS       Neuro/Psych negative neurological ROS  negative psych ROS   GI/Hepatic negative GI ROS, Neg liver ROS,   Endo/Other  negative endocrine ROS  Renal/GU      Musculoskeletal   Abdominal   Peds  Hematology negative hematology ROS (+)   Anesthesia Other Findings History reviewed. No pertinent past medical history.  Past Surgical History: No date: WISDOM TOOTH EXTRACTION  BMI    Body Mass Index: 28.35 kg/m      Reproductive/Obstetrics negative OB ROS                            Anesthesia Physical Anesthesia Plan  ASA: II  Anesthesia Plan: General LMA   Post-op Pain Management:    Induction:   PONV Risk Score and Plan: 3 and Ondansetron, Dexamethasone, Midazolam and Treatment may vary due to age or medical condition  Airway Management Planned:   Additional Equipment:   Intra-op Plan:   Post-operative Plan:   Informed Consent: I have reviewed the patients History and Physical, chart, labs and discussed the procedure including the risks, benefits and alternatives for the proposed anesthesia with the patient or authorized representative who has indicated his/her understanding and acceptance.     Dental Advisory Given  Plan Discussed with: Anesthesiologist, CRNA and Surgeon  Anesthesia Plan Comments:        Anesthesia Quick Evaluation

## 2019-01-07 NOTE — H&P (Signed)
PREOPERATIVE H&P  Chief Complaint: glass in left hand  HPI: Meghan Barber is a 28 y.o. female who presents for preoperative history and physical with a diagnosis of glass in left hand. Symptoms are rated as moderate to severe, and have been worsening.  This is significantly impairing activities of daily living.  She has elected for surgical management.   History reviewed. No pertinent past medical history. Past Surgical History:  Procedure Laterality Date  . WISDOM TOOTH EXTRACTION     Social History   Socioeconomic History  . Marital status: Single    Spouse name: Not on file  . Number of children: Not on file  . Years of education: Not on file  . Highest education level: Not on file  Occupational History  . Not on file  Social Needs  . Financial resource strain: Not on file  . Food insecurity    Worry: Not on file    Inability: Not on file  . Transportation needs    Medical: Not on file    Non-medical: Not on file  Tobacco Use  . Smoking status: Former Games developer  . Smokeless tobacco: Never Used  Substance and Sexual Activity  . Alcohol use: Yes    Comment: occ  . Drug use: No  . Sexual activity: Not on file  Lifestyle  . Physical activity    Days per week: 1 day    Minutes per session: 120 min  . Stress: Very much  Relationships  . Social Musician on phone: Not on file    Gets together: Not on file    Attends religious service: Not on file    Active member of club or organization: Not on file    Attends meetings of clubs or organizations: Not on file    Relationship status: Not on file  Other Topics Concern  . Not on file  Social History Narrative   Patient reports that she and her boyfriend of 3 years live together. She reports not having many friends, and doesn't have any family in [place]. Patient reports that her family lives in [place]. She has a close relationship with her sister, and also reports having good relationships with her other family  members.    Family History  Problem Relation Age of Onset  . Thyroid disease Mother   . Bipolar disorder Mother   . Alcoholism Father   . Drug abuse Brother    Allergies  Allergen Reactions  . Lidocaine Other (See Comments)    syncope  . Percocet [Oxycodone-Acetaminophen] Nausea And Vomiting   Prior to Admission medications   Medication Sig Start Date End Date Taking? Authorizing Provider  Norgestimate-Ethinyl Estradiol Triphasic (ORTHO TRI-CYCLEN, 28,) 0.18/0.215/0.25 MG-35 MCG tablet Take 1 tablet by mouth daily. 03/03/18   Sciora, Austin Miles, CNM  Norgestimate-Ethinyl Estradiol Triphasic 0.18/0.215/0.25 MG-25 MCG tab Take 1 tablet by mouth daily. 10/17/18   Tessa Lerner, NP     Positive ROS: All other systems have been reviewed and were otherwise negative with the exception of those mentioned in the HPI and as above.  Physical Exam: General: Alert, no acute distress Cardiovascular: Regular rate and rhythm, no murmurs rubs or gallops.  No pedal edema Respiratory: Clear to auscultation bilaterally, no wheezes rales or rhonchi. No cyanosis, no use of accessory musculature GI: No organomegaly, abdomen is soft and non-tender nondistended with positive bowel sounds. Skin: Skin intact, no lesions within the operative field. Neurologic: Sensation intact distally Psychiatric: Patient is competent  for consent with normal mood and affect Lymphatic: No axillary or cervical lymphadenopathy  MUSCULOSKELETAL: left hand with 4 mm puncture wound volar at the distal aspect of the carpal tunnel, flexion, extension and intrinsics intact Pain with flexion and pronation, decreased sensation ulnar aspect of index and radial aspect of long, good cap refill, 2+radial  Assessment: glass in left hand, nerve injury  Plan: Plan for Procedure(s): INCISION AND DRAINAGE with REMOVAL OF FOREIGN BODY, LEFT HAND  I discussed the risks and benefits of surgery. The risks include but are not limited  to infection, bleeding requiring blood transfusion, nerve or blood vessel injury, joint stiffness or loss of motion, persistent pain, weakness or instability, malunion, nonunion and hardware failure and the need for further surgery. Medical risks include but are not limited to DVT and pulmonary embolism, myocardial infarction, stroke, pneumonia, respiratory failure and death. Patient understood these risks and wished to proceed.   Lovell Sheehan, MD   01/07/2019 5:29 PM

## 2019-01-07 NOTE — Transfer of Care (Signed)
Immediate Anesthesia Transfer of Care Note  Patient: Meghan Barber  Procedure(s) Performed: INCISION AND DRAINAGE and removal foreign body (Left )  Patient Location: PACU  Anesthesia Type:General  Level of Consciousness: awake, alert  and oriented  Airway & Oxygen Therapy: Patient Spontanous Breathing and Patient connected to nasal cannula oxygen  Post-op Assessment: Report given to RN and Post -op Vital signs reviewed and stable  Post vital signs: Reviewed and stable  Last Vitals:  Vitals Value Taken Time  BP 117/61 01/07/19 1922  Temp    Pulse 107 01/07/19 1922  Resp 26 01/07/19 1922  SpO2 100 % 01/07/19 1922  Vitals shown include unvalidated device data.  Last Pain:  Vitals:   01/07/19 1348  TempSrc:   PainSc: 5          Complications: No apparent anesthesia complications

## 2019-01-07 NOTE — ED Notes (Addendum)
Late entry: patient's employer Lory from [facility], [address], [place], [place] [address], [phone] called and gave instruction that patient needs a urine drug screen for Workman's comp claim. ED tech Cowan informed.
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
## 2019-01-08 ENCOUNTER — Encounter: Payer: Self-pay | Admitting: Orthopedic Surgery

## 2019-01-09 ENCOUNTER — Ambulatory Visit: Payer: Self-pay | Admitting: Licensed Clinical Social Worker

## 2019-01-09 NOTE — Anesthesia Postprocedure Evaluation (Signed)
Anesthesia Post Note  Patient: Meghan Barber  Procedure(s) Performed: INCISION AND DRAINAGE and removal foreign body (Left )  Patient location during evaluation: PACU Anesthesia Type: General Level of consciousness: awake and alert Pain management: pain level controlled Vital Signs Assessment: post-procedure vital signs reviewed and stable Respiratory status: spontaneous breathing, nonlabored ventilation and respiratory function stable Cardiovascular status: blood pressure returned to baseline and stable Postop Assessment: no apparent nausea or vomiting Anesthetic complications: no     Last Vitals:  Vitals:   01/07/19 2005 01/07/19 2019  BP: 119/75 120/76  Pulse: 72 68  Resp: 19 20  Temp: 36.5 C   SpO2: 97% 97%    Last Pain:  Vitals:   01/07/19 2019  TempSrc:   PainSc: [place]

## 2019-01-10 ENCOUNTER — Ambulatory Visit: Payer: Self-pay | Admitting: Licensed Clinical Social Worker

## 2019-02-12 ENCOUNTER — Ambulatory Visit: Payer: Self-pay | Admitting: Licensed Clinical Social Worker

## 2019-02-22 ENCOUNTER — Other Ambulatory Visit: Payer: Self-pay

## 2019-02-22 ENCOUNTER — Ambulatory Visit (LOCAL_COMMUNITY_HEALTH_CENTER): Payer: Self-pay | Admitting: Family Medicine

## 2019-02-22 ENCOUNTER — Encounter: Payer: Self-pay | Admitting: Family Medicine

## 2019-02-22 VITALS — BP 99/54 | Ht 62.0 in | Wt 162.4 lb

## 2019-02-22 DIAGNOSIS — Z3041 Encounter for surveillance of contraceptive pills: Secondary | ICD-10-CM

## 2019-02-22 DIAGNOSIS — Z30011 Encounter for initial prescription of contraceptive pills: Secondary | ICD-10-CM

## 2019-02-22 DIAGNOSIS — F1290 Cannabis use, unspecified, uncomplicated: Secondary | ICD-10-CM

## 2019-02-22 MED ORDER — NORGESTIM-ETH ESTRAD TRIPHASIC 0.18/0.215/0.25 MG-25 MCG PO TABS: 1.0000 | ORAL_TABLET | Freq: Every day | ORAL | 0 refills | Status: DC

## 2019-02-22 MED ORDER — NORGESTIM-ETH ESTRAD TRIPHASIC 0.18/0.215/0.25 MG-35 MCG PO TABS: 1.0000 | ORAL_TABLET | Freq: Every day | ORAL | 0 refills | Status: AC

## 2019-02-22 NOTE — Progress Notes (Addendum)
Family Planning Visit  Subjective:  Meghan Barber is a 28 y.o. being seen today for  Chief Complaint  Patient presents with  . Contraception    need more birth control pills    Pt has H/O self mutilation; History of bulimia nervosa; Genital HSV; Overweight; and Adjustment disorder with mixed anxiety and depressed mood on their problem list.  HPI  Patient reports here for refill of ortho tri cyclen OCPs. She was prescribed 3 packs, ran out about 1 month ago. States they are working well, would like to continue.   Pt does not meet any of the following contraindications to estrogen use: -Age ?35 years and smoking ?15 cigarettes per day -Migraine with aura -Two or more RF for arterial CVD (such as older age, smoking, diabetes, and hypertension) -HTN -Breast cancer -VTE hx or acute event -Known thrombogenic mutations -Known ischemic heart disease -History of stroke -Complicated valvular heart disease (pulmonary HTN, risk for afib, hx subacute bacterial endocarditis) -Cirrhosis, Hepatocellular adenoma or malignant hepatoma      Patient's last menstrual period was 02/05/2019 (exact date). Last sex: 02/04/19 BCM: nothing currently Pt desires EC? n/a  Pap due: 2023  Patient reports 1 partner(s) in last year. Do they desire STI screening (if no, why not)? Declines, not interested.  Does the patient desire a pregnancy in the next year? no   28 y.o., Body mass index is 29.7 kg/m. - Is patient eligible for HA1C diabetes screening based on BMI and age >50?  no  Does the patient have a current or past history of drug use? yes No components found for: HCV  See flowsheet for other program required questions.   Health Maintenance Due  Topic Date Due  . HIV Screening  09/23/2007  . INFLUENZA VACCINE  09/23/2018    ROS  The following portions of the patient's history were reviewed and updated as appropriate: allergies, current medications, past family history, past medical  history, past social history, past surgical history and problem list. Problem list updated.  Objective:  BP (!) 99/54   Ht 5\' 2"  (1.575 m)   Wt 162 lb 6.4 oz (73.7 kg)   LMP 02/05/2019 (Exact Date) Comment: normal period  BMI 29.70 kg/m    Physical Exam  Gen: well appearing, NAD HEENT: no scleral icterus CV: RR Lung: Normal WOB Ext: warm well perfused    Assessment and Plan:  Meghan Barber is a 28 y.o. female presenting to the [facility] Department for a well woman exam/family planning visit  Contraception counseling: Reviewed all forms of birth control options in the tiered based approach including abstinence; over the counter/barrier methods; hormonal contraceptive medication including pill, patch, ring, injection, contraceptive implant; hormonal and nonhormonal IUDs; permanent sterilization options including vasectomy and the various tubal sterilization modalities. Risks, benefits, how to discontinue and typical effectiveness rates were reviewed.  Questions were answered.  Written information was also given to the patient to review.  Patient desires continue OCP, this was prescribed for patient. She will follow up in  1 year for surveillance.  She was told to call with any further questions, or with any concerns about this method of contraception.  Emphasized use of condoms 100% of the time for STI prevention.  Emergency Contraception: n/a    1. Encounter for surveillance of contraceptive pills -Refill OCP x1 yr. Counseling as above.  -She declines STI testing.  -Pap and CBE will be due in 2023. - Norgestimate-Ethinyl Estradiol Triphasic (TRI-SPRINTEC) 0.18/0.215/0.25 MG-35 MCG tablet; Take  1 tablet by mouth daily.  Dispense: 13 Package; Refill: 0  2. Marijuana use Pt endorses marijuana use and that her hope is to switch to a medication to help with mood. She is speaking with our beh health dept already and hopes to be connected to a psychiatrist for this.      Return in about 1 year (around 02/22/2020) for yearly wellness exam.  No future appointments.  Kandee Keen, PA-C

## 2019-02-22 NOTE — Progress Notes (Signed)
Pt is here for more birth control pills. Pt reports she ran out of birth control pills about 1 month ago. Pt reports has been forgetting to take them and may want to discuss the IUD later on. Pt's last sex was 02/04/2019 without condom.Ronny Bacon, RN

## 2019-02-22 NOTE — Progress Notes (Signed)
Pt received Tri Sprintec (Ortho Tri Cyclen) #6 packs today due to expiration date and supply available, per provider verbal order. Pt aware that when she opens up her last pack of pills to give [place] a call so she can RTC for the remaining packs we owe her before she runs out of pills. Pt states understanding. Provider orders completed.Ronny Bacon, RN

## 2019-02-22 NOTE — Addendum Note (Signed)
Addended by: Kandee Keen on: 02/22/2019 12:03 PM   Modules accepted: Orders

## 2019-04-13 NOTE — Progress Notes (Signed)
Chart reviewed by Pharmacist  Suzanne Walker PharmD, Contract Pharmacist at [facility]  

## 2019-06-18 ENCOUNTER — Telehealth: Payer: Self-pay | Admitting: Licensed Clinical Social Worker

## 2019-06-18 NOTE — Telephone Encounter (Signed)
Attempted to return vm requesting informaiton regarding previous treatment record and possible referral to a new provider. VM is full and was unable to leave msg.

## 2019-06-25 ENCOUNTER — Telehealth: Payer: Self-pay | Admitting: Licensed Clinical Social Worker

## 2019-06-25 NOTE — Telephone Encounter (Signed)
attempted to return vm left on 06/22/19. VM is full.
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
## 2021-04-01 IMAGING — DX DG HAND 2V*L*
2 series · 2 of 2 positions shown · non-contrast
Comparison: 01/07/2019
COMPARISON: 01/07/2019

Addendum:
CLINICAL DATA: Foreign body removal

EXAM:
LEFT HAND - 2 VIEW

[hand ap]
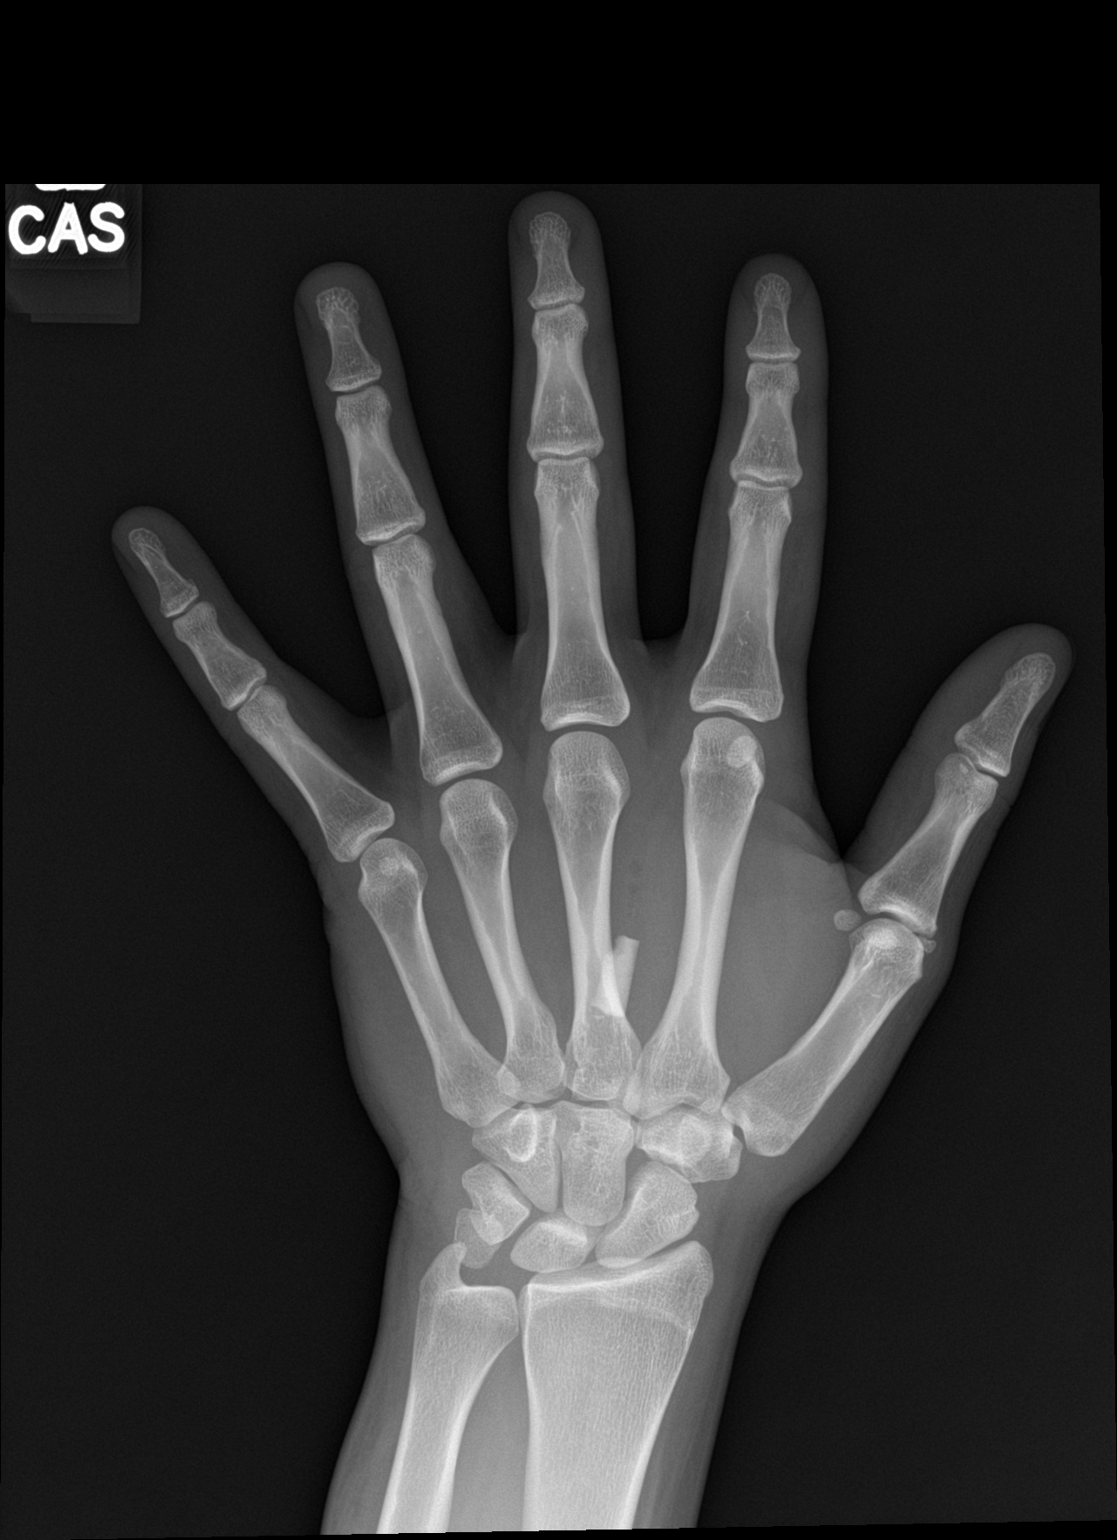

[hand lat]
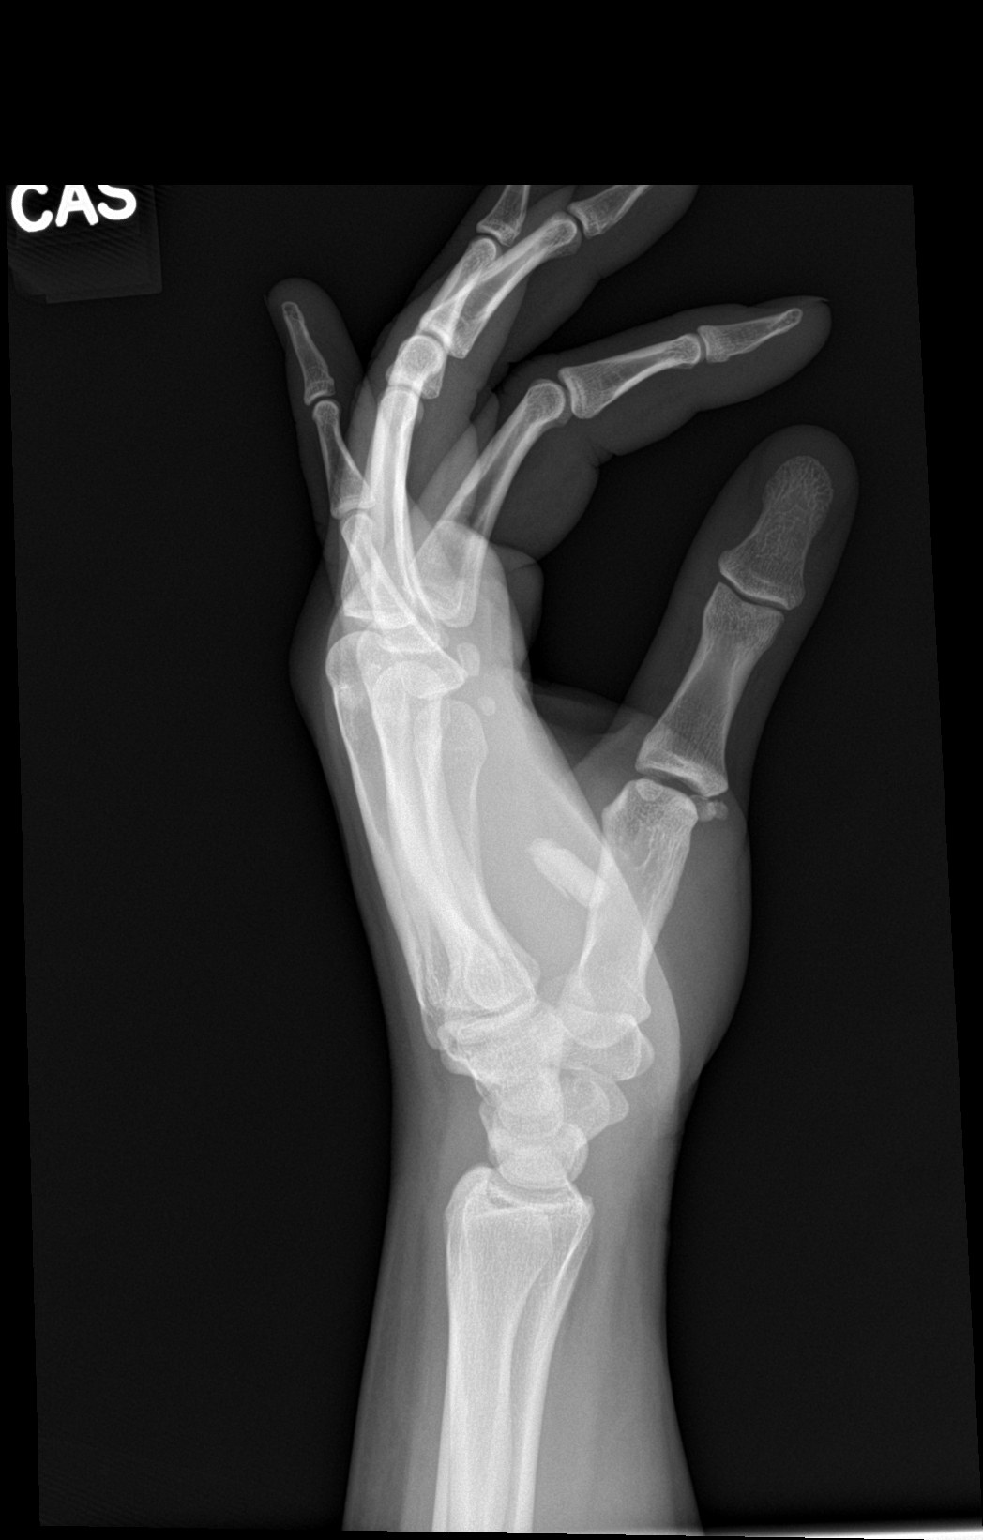

[2 of 2 positions shown; findings below may reference images not displayed]

FINDINGS: Foreign body remains in the volar soft tissues with associated soft
tissue swelling. No signs of concomitant fracture on AP and lateral
views.
IMPRESSION: Foreign body in the volar soft tissues of the left hand without
associated fracture, not significantly changed compared to recent
radiograph.

A call is out to the referring provider to discuss findings and
above case.

ADDENDUM:
These results were called by telephone at the time of interpretation
on 01/07/2019 at [DATE] to provider MONRROY ORIZ , who verbally
acknowledged these results.

*** End of Addendum ***
FINDINGS: Foreign body remains in the volar soft tissues with associated soft
tissue swelling. No signs of concomitant fracture on AP and lateral
views.
IMPRESSION: Foreign body in the volar soft tissues of the left hand without
associated fracture, not significantly changed compared to recent
radiograph.

A call is out to the referring provider to discuss findings and
above case.
